# Patient Record
Sex: Female | Born: 2000 | Race: White | Hispanic: No | Marital: Single | Smoking: Never smoker
Health system: Southern US, Community
[De-identification: ages and names within clinical notes are randomized; demographics above are authoritative.]

## PROBLEM LIST (undated history)

## (undated) DIAGNOSIS — J45909 Unspecified asthma, uncomplicated: Secondary | ICD-10-CM

## (undated) DIAGNOSIS — K219 Gastro-esophageal reflux disease without esophagitis: Secondary | ICD-10-CM

## (undated) DIAGNOSIS — F988 Other specified behavioral and emotional disorders with onset usually occurring in childhood and adolescence: Secondary | ICD-10-CM

## (undated) HISTORY — PX: OTHER SURGICAL HISTORY: SHX169

## (undated) HISTORY — PX: TONSILLECTOMY: SUR1361

## (undated) HISTORY — PX: TONSILLECTOMY AND ADENOIDECTOMY: SHX28

---
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
## 2020-11-16 ENCOUNTER — Emergency Department (HOSPITAL_BASED_OUTPATIENT_CLINIC_OR_DEPARTMENT_OTHER)
Admission: EM | Admit: 2020-11-16 | Discharge: 2020-11-17 | Disposition: A | Discharge status: Home / Self Care | Payer: Medicaid Other | Attending: Emergency Medicine | Admitting: Emergency Medicine

## 2020-11-16 ENCOUNTER — Other Ambulatory Visit: Payer: Self-pay

## 2020-11-16 ENCOUNTER — Encounter (HOSPITAL_BASED_OUTPATIENT_CLINIC_OR_DEPARTMENT_OTHER): Payer: Self-pay | Admitting: Emergency Medicine

## 2020-11-16 DIAGNOSIS — J45909 Unspecified asthma, uncomplicated: Secondary | ICD-10-CM | POA: Diagnosis not present

## 2020-11-16 DIAGNOSIS — R109 Unspecified abdominal pain: Secondary | ICD-10-CM | POA: Diagnosis present

## 2020-11-16 DIAGNOSIS — Z7951 Long term (current) use of inhaled steroids: Secondary | ICD-10-CM | POA: Insufficient documentation

## 2020-11-16 DIAGNOSIS — B3731 Acute candidiasis of vulva and vagina: Secondary | ICD-10-CM | POA: Diagnosis not present

## 2020-11-16 LAB — PREGNANCY, URINE: Preg Test, Ur: NEGATIVE

## 2020-11-16 NOTE — ED Provider Notes (Signed)
[facility] Provider Note: Christine Dell, MD, FACEP  CSN: [id number] MRN: [id number] ARRIVAL: 11/16/20 at 2130 ROOM: [facility]   CHIEF COMPLAINT  Abdominal Cramping   HISTORY OF PRESENT ILLNESS  11/16/20 11:39 PM Christine Clayton is a 20 y.o. female reportedly had a faintly positive pregnancy test 3 days ago.  The next day she developed a sensation of something wanting to "fall out" of her vagina.  This has been accompanied by cramping which she rates as a 6 out of 10.  It is also been associated with vulvovaginal itching and a cottage cheeselike discharge.  She denies vaginal bleeding.  She denies dysuria.    Past Medical History:  Diagnosis Date   ADD (attention deficit disorder)    Asthma    GERD (gastroesophageal reflux disease)     Past Surgical History:  Procedure Laterality Date   TONSILLECTOMY AND ADENOIDECTOMY     tubes in ears      Family History  Problem Relation Age of Onset   Allergic rhinitis Neg Hx    Angioedema Neg Hx    Asthma Neg Hx    Eczema Neg Hx    Immunodeficiency Neg Hx    Urticaria Neg Hx     Social History   Tobacco Use   Smoking status: Never   Smokeless tobacco: Never  Vaping Use   Vaping Use: Never used  Substance Use Topics   Alcohol use: No   Drug use: No    Prior to Admission medications   Medication Sig Start Date End Date Taking? Authorizing Provider  fluconazole (DIFLUCAN) 150 MG tablet Take on Wednesday, 11/20/2020. 11/17/20  Yes Emillie Chasen, MD  albuterol (PROAIR HFA) 108 (90 Base) MCG/ACT inhaler Inhale 2 puffs into the lungs every 4 (four) hours as needed for wheezing or shortness of breath. 10/22/17   Bobbitt, Heywood Iles, MD  albuterol (PROVENTIL) (2.5 MG/3ML) 0.083% nebulizer solution Take 3 mLs (2.5 mg total) by nebulization every 6 (six) hours as needed for wheezing or shortness of breath. 08/26/16   Bobbitt, Heywood Iles, MD  Amphet-Dextroamphet 3-Bead ER (MYDAYIS) 12.5 MG CP24 Take by mouth. MYDAYIS     [provider]  diphenhydrAMINE (SOMINEX) 25 MG tablet Take 25 mg by mouth at bedtime as needed for sleep.    [provider]  EPINEPHrine (EPIPEN 2-PAK) 0.3 mg/0.3 mL IJ SOAJ injection Use as directed for severe allergic reactions 08/18/17   Bobbitt, Heywood Iles, MD  fexofenadine (ALLEGRA) 180 MG tablet Take 180 mg by mouth daily.    [provider]  FLUoxetine (PROZAC) 10 MG tablet Take 10 mg by mouth daily.    [provider]  fluticasone (FLONASE) 50 MCG/ACT nasal spray Place 1 spray into both nostrils daily as needed for allergies or rhinitis. 08/18/17   Bobbitt, Heywood Iles, MD  hydrOXYzine (ATARAX/VISTARIL) 25 MG tablet Take 25 mg by mouth as needed.     [provider]  lansoprazole (PREVACID) 30 MG capsule Take 30 mg by mouth daily.    [provider]  Olopatadine HCl (PAZEO) 0.7 % SOLN Place 1 drop into both eyes daily as needed. 08/18/17   Bobbitt, Heywood Iles, MD  omeprazole (PRILOSEC) 20 MG capsule Take 1 capsule (20 mg total) by mouth daily before breakfast. 08/18/17   Bobbitt, Heywood Iles, MD  Spacer/Aero-Holding Chambers [facility] DIAMOND) DEVI 2 puffs by Does not apply route every 4 (four) hours as needed. 10/19/17   Bobbitt, Heywood Iles, MD    Allergies  Patient has no known allergies.   REVIEW OF SYSTEMS  Negative except as noted here or in the History of Present Illness.   PHYSICAL EXAMINATION  Initial Vital Signs Blood pressure 112/78, pulse 75, temperature 97.9 F (36.6 C), temperature source Oral, resp. rate 18, height 5\' 1"  (1.549 m), weight 73.4 kg, last menstrual period 10/22/2020, SpO2 99 %.  Examination General: Well-developed, well-nourished female in no acute distress; appearance consistent with age of record HENT: normocephalic; atraumatic Eyes: Normal appearance Neck: supple Heart: regular rate and rhythm Lungs: clear to auscultation bilaterally Abdomen: soft; nondistended; mild suprapubic  tenderness; no masses or hepatosplenomegaly; bowel sounds present GU: No CVA tenderness; white, curd-like vulvovaginal discharge; no vaginal bleeding; mild cervical motion and bilateral adnexal tenderness Extremities: No deformity; full range of motion; pulses normal Neurologic: Awake, alert and oriented; motor function intact in all extremities and symmetric; no facial droop Skin: Warm and dry Psychiatric: Normal mood and affect   RESULTS  Summary of this visit's results, reviewed and interpreted by myself:   EKG Interpretation  Date/Time:    Ventricular Rate:    PR Interval:    QRS Duration:   QT Interval:    QTC Calculation:   R Axis:     Text Interpretation:         Laboratory Studies: Results for orders placed or performed during the hospital encounter of 11/16/20 (from the past 24 hour(s))  Pregnancy, urine     Status: None   Collection Time: 11/16/20  9:45 PM  Result Value Ref Range   Preg Test, Ur NEGATIVE NEGATIVE  Urinalysis, Routine w reflex microscopic Urine, Clean Catch     Status: Abnormal   Collection Time: 11/16/20 11:41 PM  Result Value Ref Range   Color, Urine YELLOW YELLOW   APPearance HAZY (A) CLEAR   Specific Gravity, Urine 1.020 1.005 - 1.030   pH 7.5 5.0 - 8.0   Glucose, UA NEGATIVE NEGATIVE mg/dL   Hgb urine dipstick NEGATIVE NEGATIVE   Bilirubin Urine NEGATIVE NEGATIVE   Ketones, ur NEGATIVE NEGATIVE mg/dL   Protein, ur NEGATIVE NEGATIVE mg/dL   Nitrite NEGATIVE NEGATIVE   Leukocytes,Ua LARGE (A) NEGATIVE  Urinalysis, Microscopic (reflex)     Status: Abnormal   Collection Time: 11/16/20 11:41 PM  Result Value Ref Range   RBC / HPF 0-5 0 - 5 RBC/hpf   WBC, UA 21-50 0 - 5 WBC/hpf   Bacteria, UA MANY (A) NONE SEEN   Squamous Epithelial / LPF 6-10 0 - 5   Budding Yeast PRESENT    Imaging Studies: No results found.  ED COURSE and MDM  Nursing notes, initial and subsequent vitals signs, including pulse oximetry, reviewed and interpreted by  myself.  Vitals:   11/16/20 2139 11/16/20 2140 11/17/20 0013  BP: 112/78  104/61  Pulse: 75  92  Resp: 18  15  Temp: 97.9 F (36.6 C)    TempSrc: Oral    SpO2: 99%  98%  Weight:  73.4 kg   Height:  5\' 1"  (1.549 m)    Medications  fluconazole (DIFLUCAN) tablet 150 mg (150 mg Oral Given 11/17/20 0122)   History and evaluation consistent with vulvovaginal candidiasis.  The patient's pyuria is likely due to the vulvovaginal candidiasis contaminating her specimen.  She is not having dysuria.  We will treat with Diflucan.  GC and Chlamydia testing pending   PROCEDURES  Procedures   ED DIAGNOSES     ICD-10-CM   1. Candidal vulvovaginitis  B37.31  Paula Libra, MD 11/17/20 [phone]

## 2020-11-16 NOTE — ED Triage Notes (Signed)
Reports having a positive pregnancy test on Wednesday.  Now having vaginal pressure and some spotting.

## 2020-11-17 LAB — URINALYSIS, ROUTINE W REFLEX MICROSCOPIC
Bilirubin Urine: NEGATIVE
Glucose, UA: NEGATIVE mg/dL
Hgb urine dipstick: NEGATIVE
Ketones, ur: NEGATIVE mg/dL
Nitrite: NEGATIVE
Protein, ur: NEGATIVE mg/dL
Specific Gravity, Urine: 1.02 (ref 1.005–1.030)
pH: 7.5 (ref 5.0–8.0)

## 2020-11-17 LAB — URINALYSIS, MICROSCOPIC (REFLEX)

## 2020-11-17 LAB — WET PREP, GENITAL
Clue Cells Wet Prep HPF POC: NONE SEEN
Sperm: NONE SEEN
Trich, Wet Prep: NONE SEEN

## 2020-11-17 MED ORDER — FLUCONAZOLE 150 MG PO TABS
150.0000 mg | ORAL_TABLET | Freq: Once | ORAL | Status: AC
  Administered 2020-11-17: 150 mg via ORAL
  Filled 2020-11-17: qty 1

## 2020-11-17 MED ORDER — FLUCONAZOLE 150 MG PO TABS: ORAL_TABLET | ORAL | 0 refills | Status: AC

## 2020-11-17 NOTE — ED Notes (Signed)
Pt NAD in bed, a/ox4. Pt states she developed 5/10 lower abd/pelvic pressure Thursday, with spotting. +home preg test. Pt denies ABD pain, n/v/d, constipation or fever. Abd soft nontender.

## 2020-11-18 LAB — GC/CHLAMYDIA PROBE AMP (~~LOC~~) NOT AT ARMC
Chlamydia: NEGATIVE
Comment: NEGATIVE
Comment: NORMAL
Neisseria Gonorrhea: NEGATIVE
